# Patient Record
Sex: Female | Born: 1965
Health system: Southern US, Community
[De-identification: ages and names within clinical notes are randomized; demographics above are authoritative.]

## PROBLEM LIST (undated history)

## (undated) DIAGNOSIS — K7460 Unspecified cirrhosis of liver: Secondary | ICD-10-CM

## (undated) DIAGNOSIS — E119 Type 2 diabetes mellitus without complications: Secondary | ICD-10-CM

---

## 2007-09-08 ENCOUNTER — Emergency Department (HOSPITAL_COMMUNITY): Admission: EM | Admit: 2007-09-08 | Discharge: 2007-09-08 | Payer: Self-pay | Admitting: Emergency Medicine
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
## 2008-05-17 ENCOUNTER — Emergency Department: Payer: Self-pay | Admitting: Emergency Medicine

## 2008-11-17 IMAGING — CR RIGHT FOREARM - 2 VIEW
1 series · 2 of 2 positions shown · non-contrast
Comparison: none

REASON FOR EXAM: foreign body  rm 1
COMMENTS:   LMP: Three weeks ago

PROCEDURE:     DXR - DXR FOREARM RIGHT  - May 18, 2008  [DATE]
RESULT:     The patient has sustained a laceration of the forearm. The
radius and ulna appear intact. I do not see evidence of radiopaque foreign
bodies within the soft tissues.

[Series 1: view not recorded · 0.17mm/px · 2 of 2 slices shown]
[im 1/2]
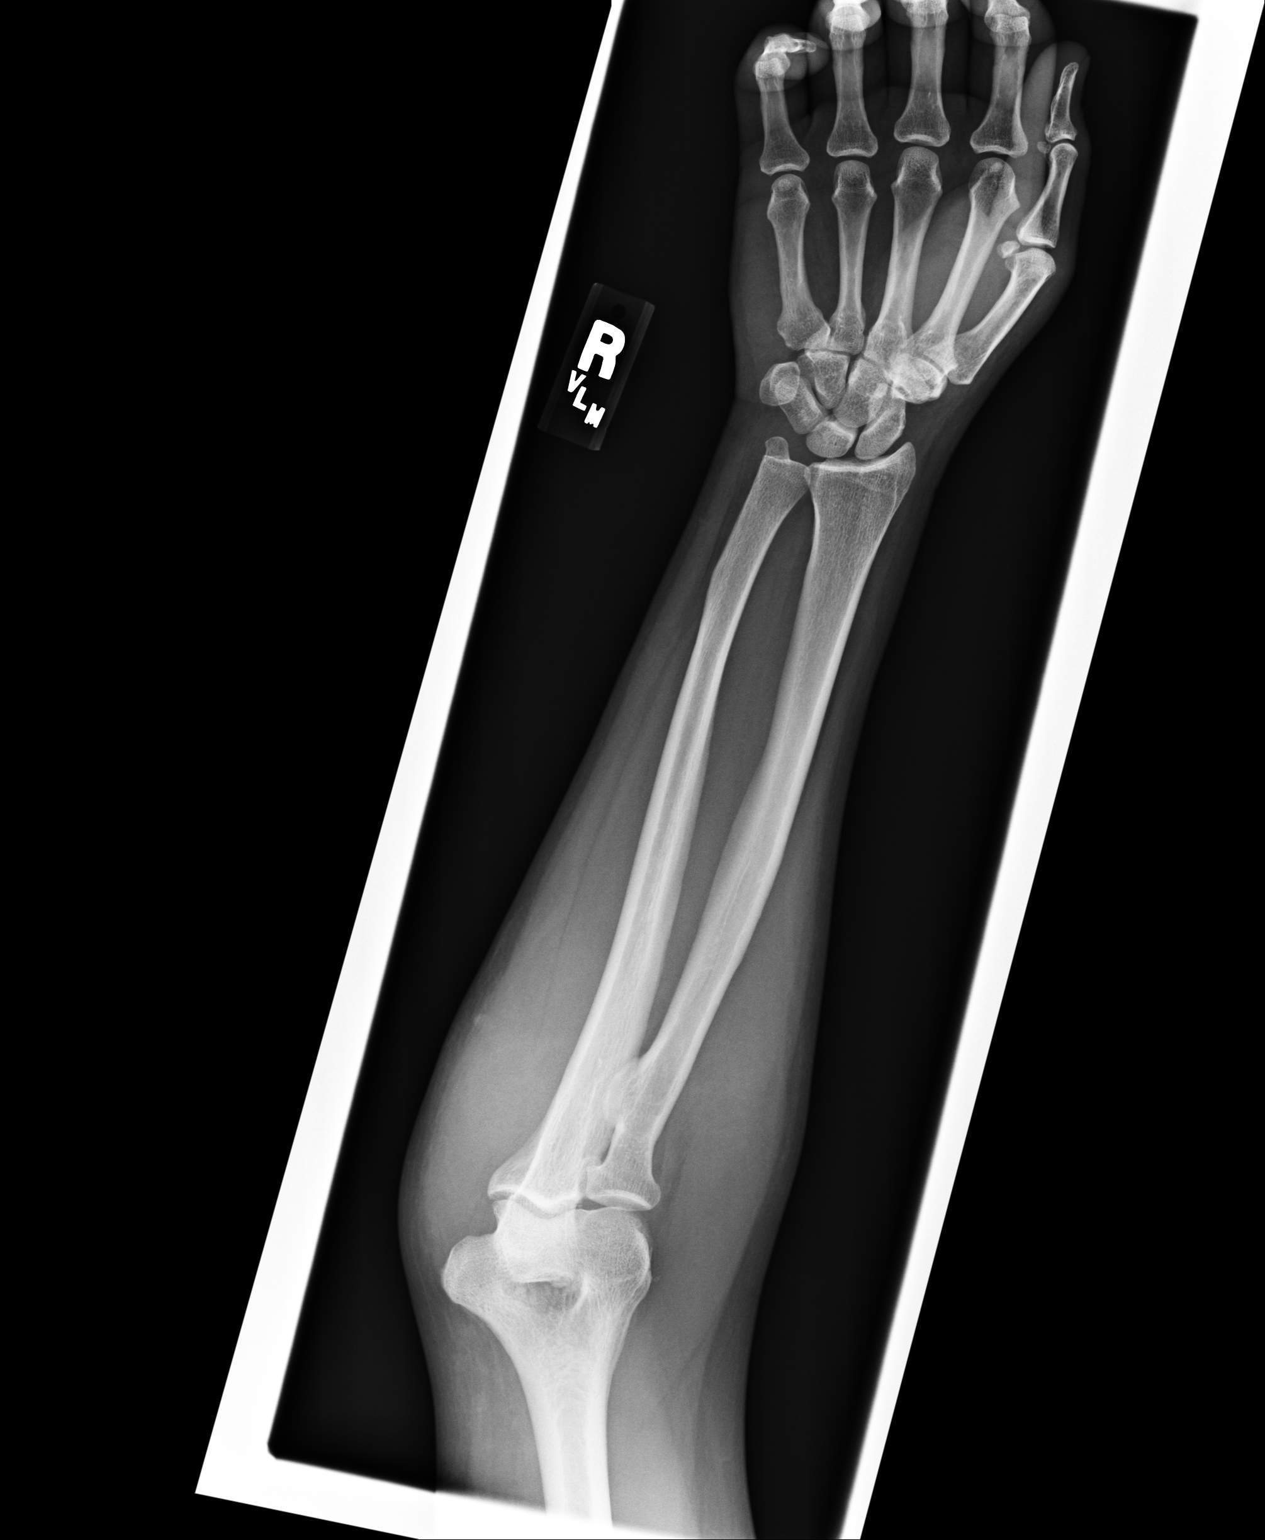
[im 2/2]
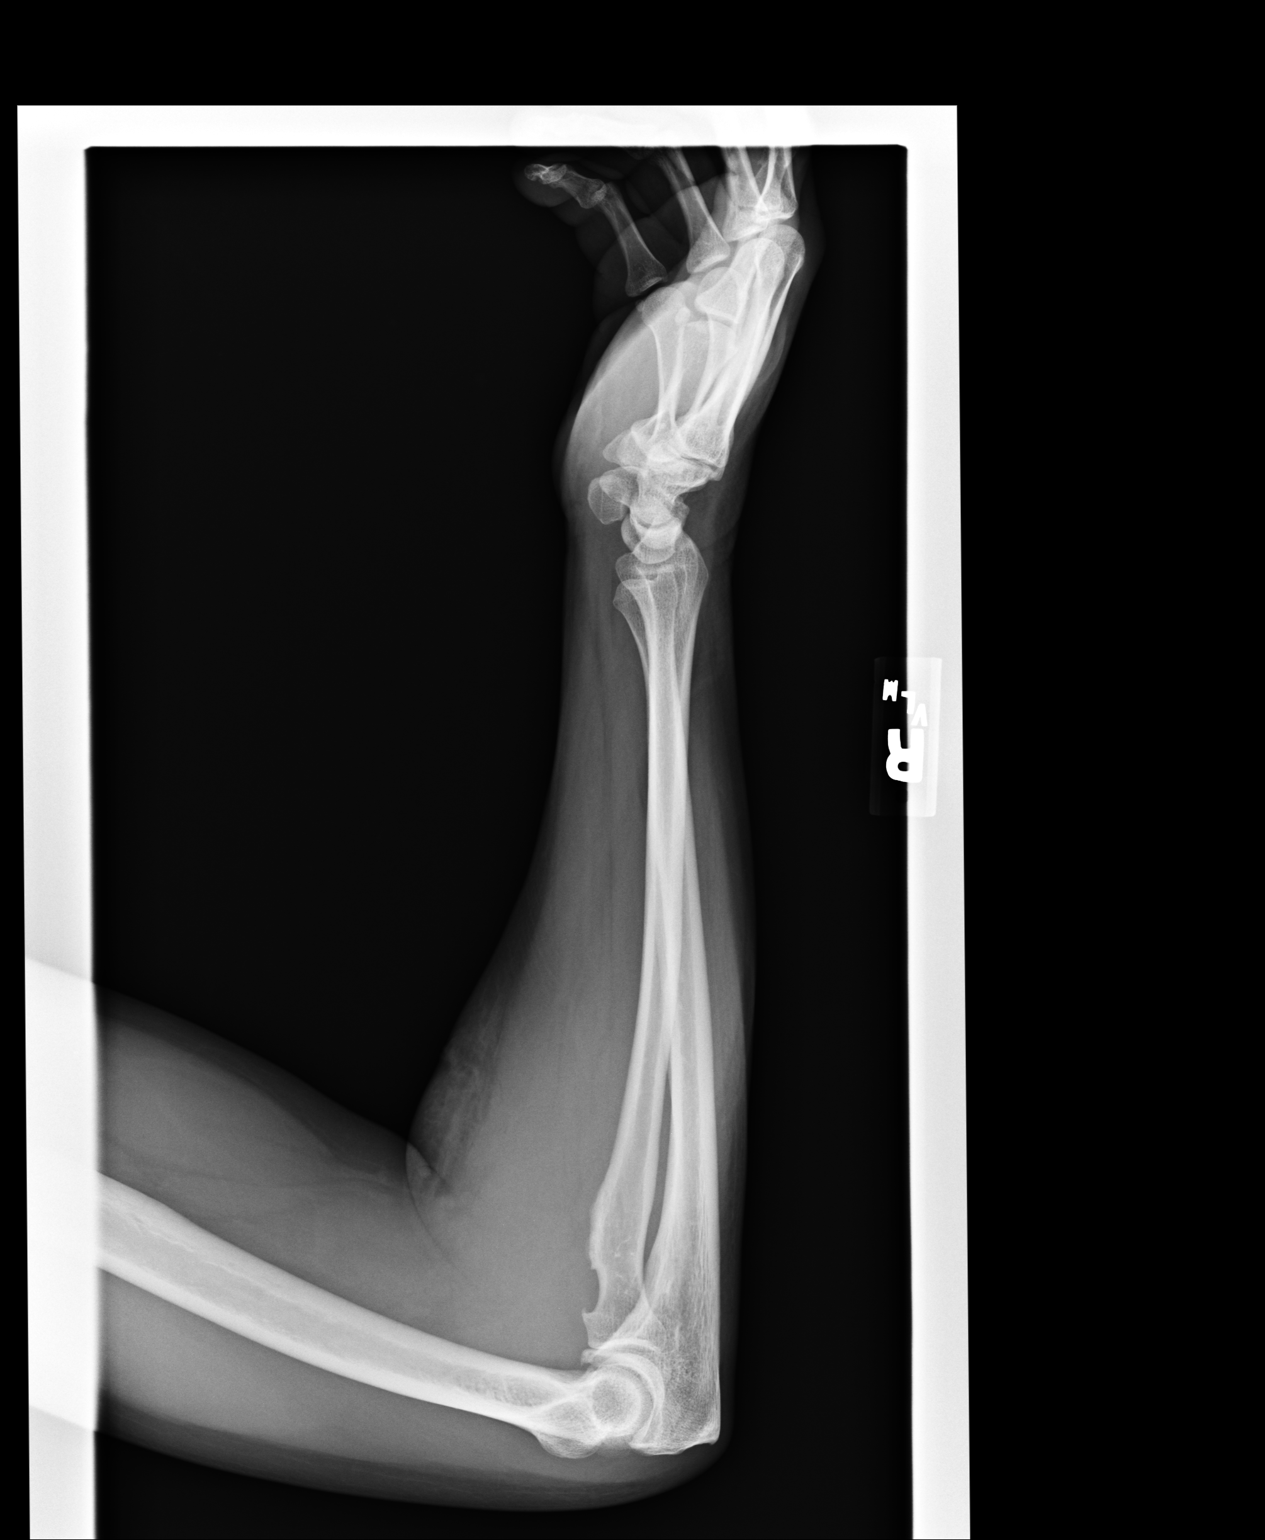

[2 of 2 positions shown; findings below may reference images not displayed]

IMPRESSION: I do not see definite evidence of retained radiopaque
foreign body. No acute bony abnormality is demonstrated.

## 2017-11-20 ENCOUNTER — Emergency Department (HOSPITAL_COMMUNITY)
Admission: EM | Admit: 2017-11-20 | Discharge: 2017-11-20 | Disposition: A | Discharge status: Home / Self Care | Payer: Self-pay | Attending: Emergency Medicine | Admitting: Emergency Medicine

## 2017-11-20 ENCOUNTER — Encounter (HOSPITAL_COMMUNITY): Payer: Self-pay | Admitting: Emergency Medicine

## 2017-11-20 DIAGNOSIS — E119 Type 2 diabetes mellitus without complications: Secondary | ICD-10-CM | POA: Insufficient documentation

## 2017-11-20 DIAGNOSIS — R188 Other ascites: Secondary | ICD-10-CM | POA: Insufficient documentation

## 2017-11-20 DIAGNOSIS — K7460 Unspecified cirrhosis of liver: Secondary | ICD-10-CM | POA: Insufficient documentation

## 2017-11-20 DIAGNOSIS — R51 Headache: Secondary | ICD-10-CM | POA: Insufficient documentation

## 2017-11-20 DIAGNOSIS — F17210 Nicotine dependence, cigarettes, uncomplicated: Secondary | ICD-10-CM | POA: Insufficient documentation

## 2017-11-20 HISTORY — DX: Type 2 diabetes mellitus without complications: E11.9

## 2017-11-20 HISTORY — DX: Unspecified cirrhosis of liver: K74.60

## 2017-11-20 LAB — CBC WITH DIFFERENTIAL/PLATELET
BASOS ABS: 0 10*3/uL (ref 0.0–0.1)
BASOS PCT: 0 %
EOS PCT: 0 %
Eosinophils Absolute: 0 10*3/uL (ref 0.0–0.7)
HCT: 30.8 % — ABNORMAL LOW (ref 36.0–46.0)
Hemoglobin: 10.4 g/dL — ABNORMAL LOW (ref 12.0–15.0)
Lymphocytes Relative: 17 %
Lymphs Abs: 1.5 10*3/uL (ref 0.7–4.0)
MCH: 33.3 pg (ref 26.0–34.0)
MCHC: 33.8 g/dL (ref 30.0–36.0)
MCV: 98.7 fL (ref 78.0–100.0)
MONO ABS: 0.9 10*3/uL (ref 0.1–1.0)
Monocytes Relative: 10 %
Neutro Abs: 6.8 10*3/uL (ref 1.7–7.7)
Neutrophils Relative %: 73 %
PLATELETS: 265 10*3/uL (ref 150–400)
RBC: 3.12 MIL/uL — ABNORMAL LOW (ref 3.87–5.11)
RDW: 12.7 % (ref 11.5–15.5)
WBC: 9.3 10*3/uL (ref 4.0–10.5)

## 2017-11-20 LAB — COMPREHENSIVE METABOLIC PANEL
ALBUMIN: 1.8 g/dL — AB (ref 3.5–5.0)
ALK PHOS: 182 U/L — AB (ref 38–126)
ALT: 23 U/L (ref 14–54)
AST: 66 U/L — AB (ref 15–41)
Anion gap: 8 (ref 5–15)
BILIRUBIN TOTAL: 1.6 mg/dL — AB (ref 0.3–1.2)
CALCIUM: 8.1 mg/dL — AB (ref 8.9–10.3)
CO2: 23 mmol/L (ref 22–32)
CREATININE: 0.34 mg/dL — AB (ref 0.44–1.00)
Chloride: 97 mmol/L — ABNORMAL LOW (ref 101–111)
GFR calc Af Amer: >60 mL/min (ref >60)
GFR calc non Af Amer: >60 mL/min (ref >60)
GLUCOSE: 129 mg/dL — AB (ref 65–99)
Potassium: 3.5 mmol/L (ref 3.5–5.1)
Sodium: 128 mmol/L — ABNORMAL LOW (ref 135–145)
TOTAL PROTEIN: 6 g/dL — AB (ref 6.5–8.1)

## 2017-11-20 LAB — PROTIME-INR
INR: 1.26
PROTHROMBIN TIME: 15.7 s — AB (ref 11.4–15.2)

## 2017-11-20 LAB — AMMONIA: Ammonia: 23 umol/L (ref 9–35)

## 2017-11-20 MED ORDER — SPIRONOLACTONE 25 MG PO TABS: 25.0000 mg | ORAL_TABLET | Freq: Two times a day (BID) | ORAL | 2 refills | Status: AC

## 2017-11-20 NOTE — ED Triage Notes (Signed)
Patient diagnosed recently with cirrhosis of liver in [place] and had little fluid drained off abd but has since come to stay with family in [place]. Patient c/o ascites that has gotten worse over past 3 weeks and swelling in bilat lower extremities.

## 2017-11-20 NOTE — ED Provider Notes (Signed)
[facility]-EMERGENCY DEPT Provider Note   CSN: [id number] Arrival date & time: 11/20/17  0847     History   Chief Complaint Chief Complaint  Patient presents with  . Ascites  . Leg Swelling  . Headache    HPI Nicole Elliott is a 51 y.o. female.  She presents for evaluation of abdominal and leg swelling, present for several weeks.  She was evaluated for the same problem at a hospital in [place], on 11/03/17.  At that time she was told that she had cirrhosis with ascites.  She did not receive treatments at discharge.  She is here, now, because her daughter encouraged the patient to come to [place] to be with her.  Patient reports occasional pain in her abdomen and legs, but denies chest pain, shortness of breath, weakness or dizziness.  She has chronically low blood pressure for which she takes Florinef.  She drinks alcohol, primarily beer.  She has not previously been treated for ascites or cirrhosis.  There are no other known modifying factors.  HPI  Past Medical History:  Diagnosis Date  . Cirrhosis (HCC)   . Diabetes mellitus without complication (HCC)     There are no active problems to display for this patient.   History reviewed. No pertinent surgical history.  OB History    No data available       Home Medications    Prior to Admission medications   Medication Sig Start Date End Date Taking? Authorizing Provider  fludrocortisone (FLORINEF) 0.1 MG tablet Take 0.1 mg by mouth 2 (two) times daily.   Yes [provider]  folic acid (FOLVITE) 400 MCG tablet Take 400 mcg by mouth daily.   Yes [provider]  Multiple Vitamins-Minerals [facility](GNP CENTURY ADULTS 50+ SENIOR) TABS Take 1 tablet by mouth daily.   Yes [provider]  sodium chloride 1 g tablet Take 1 g by mouth daily.   Yes [provider]  thiamine (VITAMIN B-1) 100 MG tablet Take 100 mg by mouth daily.   Yes [provider]    spironolactone (ALDACTONE) 25 MG tablet Take 1 tablet (25 mg total) by mouth 2 (two) times daily. 11/20/17   Mancel BaleWentz, Terrea Bruster, MD    Family History No family history on file.  Social History Social History   Tobacco Use  . Smoking status: Current Every Day Smoker    Types: Cigarettes  . Smokeless tobacco: Never Used  Substance Use Topics  . Alcohol use: Yes  . Drug use: Not on file     Allergies   Naproxen   Review of Systems Review of Systems  All other systems reviewed and are negative.    Physical Exam Updated Vital Signs BP 110/84   Pulse (!) 112   Temp 98.5 F (36.9 C) (Oral)   Resp 18   Ht 5' 5.5" (1.664 m)   SpO2 100%   Physical Exam  Constitutional: She is oriented to person, place, and time. She appears well-developed. She does not appear ill.  Appears older than stated age  HENT:  Head: Normocephalic and atraumatic.  Eyes: Conjunctivae and EOM are normal. Pupils are equal, round, and reactive to light.  Neck: Normal range of motion and phonation normal. Neck supple.  Cardiovascular: Normal rate and regular rhythm.  Pulmonary/Chest: Effort normal and breath sounds normal. She exhibits no tenderness.  Abdominal: Soft. She exhibits distension (With evaluation consistent with presence of ascites). She exhibits no mass. There is no tenderness.  There is no guarding.  Musculoskeletal: Normal range of motion. She exhibits edema (3+ bilateral legs).  Neurological: She is alert and oriented to person, place, and time. She exhibits normal muscle tone.  Skin: Skin is warm and dry.  Psychiatric: She has a normal mood and affect. Her behavior is normal. Judgment and thought content normal.  Nursing note and vitals reviewed.    ED Treatments / Results  Labs (all labs ordered are listed, but only abnormal results are displayed) Labs Reviewed  COMPREHENSIVE METABOLIC PANEL - Abnormal; Notable for the following components:      Result Value   Sodium 128 (*)     Chloride 97 (*)    Glucose, Bld 129 (*)    BUN <5 (*)    Creatinine, Ser 0.34 (*)    Calcium 8.1 (*)    Total Protein 6.0 (*)    Albumin 1.8 (*)    AST 66 (*)    Alkaline Phosphatase 182 (*)    Total Bilirubin 1.6 (*)    All other components within normal limits  CBC WITH DIFFERENTIAL/PLATELET - Abnormal; Notable for the following components:   RBC 3.12 (*)    Hemoglobin 10.4 (*)    HCT 30.8 (*)    All other components within normal limits  PROTIME-INR - Abnormal; Notable for the following components:   Prothrombin Time 15.7 (*)    All other components within normal limits  AMMONIA    EKG  EKG Interpretation None       Radiology No results found.  Procedures Procedures (including critical care time)  Medications Ordered in ED Medications - No data to display   Initial Impression / Assessment and Plan / ED Course  I have reviewed the triage vital signs and the nursing notes.  Pertinent labs & imaging results that were available during my care of the patient were reviewed by me and considered in my medical decision making (see chart for details).  Clinical Course as of Nov 20 1600  Mon Nov 20, 2017  1211 Recent visit, on 11/03/17, at [facility] [place] hospital in [place], reviewed, through care everywhere.  At that time she had nonspecific transaminitis, and left AMA prior to completion of treatment.  [EW]    Clinical Course User Index [EW] Mancel BaleWentz, Loki Wuthrich, MD     Patient Vitals for the past 24 hrs:  BP Temp Temp src Pulse Resp SpO2 Height  11/20/17 1300 110/84 - - (!) 112 18 100 % -  11/20/17 1245 - - - (!) 120 - 100 % -  11/20/17 1230 107/79 - - (!) 113 - 100 % -  11/20/17 1215 - - - (!) 106 - 99 % -  11/20/17 1200 106/80 - - (!) 108 - 97 % -  11/20/17 1114 112/82 98.5 F (36.9 C) Oral (!) 108 18 100 % -  11/20/17 0902 113/80 97.9 F (36.6 C) Oral (!) 110 18 100 % -  11/20/17 0859 - - - - - - 5' 5.5" (1.664 m)    At discharge- reevaluation with  update and discussion. After initial assessment and treatment, an updated evaluation reveals no change in clinical status, findings discussed with patient, and family member, all questions answered. Mancel BaleElliott Mckade Gurka      Final Clinical Impressions(s) / ED Diagnoses   Final diagnoses:  Other ascites    Ascites with liver disease, doubt spontaneous peritonitis.  No evidence for significant metabolic or infectious process.    Nursing Notes Reviewed/ Care Coordinated  Applicable Imaging Reviewed Interpretation of Laboratory Data incorporated into ED treatment  The patient appears reasonably screened and/or stabilized for discharge and I doubt any other medical condition or other [facility] requiring further screening, evaluation, or treatment in the ED at this time prior to discharge.  Plan: Home Medications-continue usual medications; Home Treatments-rest, low-salt diet; return here if the recommended treatment, does not improve the symptoms; Recommended follow up-PCP, as needed, and for a checkup and 1-2 weeks to establish ongoing management.   ED Discharge Orders        Ordered    spironolactone (ALDACTONE) 25 MG tablet  2 times daily     11/20/17 1318       Mancel Bale, MD 11/20/17 [phone]

## 2017-11-20 NOTE — ED Notes (Signed)
Daughter went to [employer] to get patient some food. Will return shortly

## 2017-11-20 NOTE — Discharge Instructions (Signed)
Avoid all forms of alcohol, because they can worsen your swelling and liver disease.  Try to eat a low-salt diet.  Follow-up with a primary care doctor as soon as possible for further evaluation and treatment.

## 2017-12-01 ENCOUNTER — Ambulatory Visit (INDEPENDENT_AMBULATORY_CARE_PROVIDER_SITE_OTHER): Payer: Self-pay | Admitting: Family Medicine

## 2017-12-01 ENCOUNTER — Encounter: Payer: Self-pay | Admitting: Family Medicine

## 2017-12-01 ENCOUNTER — Telehealth: Payer: Self-pay | Admitting: Family Medicine

## 2017-12-01 VITALS — BP 114/84 | HR 92 | Temp 98.7°F | Resp 14 | Ht 65.5 in | Wt 138.0 lb

## 2017-12-01 DIAGNOSIS — R8290 Unspecified abnormal findings in urine: Secondary | ICD-10-CM

## 2017-12-01 DIAGNOSIS — R200 Anesthesia of skin: Secondary | ICD-10-CM

## 2017-12-01 DIAGNOSIS — K7031 Alcoholic cirrhosis of liver with ascites: Secondary | ICD-10-CM

## 2017-12-01 DIAGNOSIS — Z131 Encounter for screening for diabetes mellitus: Secondary | ICD-10-CM

## 2017-12-01 DIAGNOSIS — R202 Paresthesia of skin: Secondary | ICD-10-CM

## 2017-12-01 DIAGNOSIS — F1010 Alcohol abuse, uncomplicated: Secondary | ICD-10-CM

## 2017-12-01 DIAGNOSIS — Z1329 Encounter for screening for other suspected endocrine disorder: Secondary | ICD-10-CM

## 2017-12-01 DIAGNOSIS — M6281 Muscle weakness (generalized): Secondary | ICD-10-CM

## 2017-12-01 DIAGNOSIS — Z23 Encounter for immunization: Secondary | ICD-10-CM

## 2017-12-01 LAB — POCT URINALYSIS DIP (DEVICE)
GLUCOSE, UA: NEGATIVE mg/dL
Hgb urine dipstick: NEGATIVE
Ketones, ur: 15 mg/dL — AB
Leukocytes, UA: NEGATIVE
Nitrite: POSITIVE — AB
PROTEIN: 30 mg/dL — AB
Specific Gravity, Urine: >=1.03 (ref 1.005–1.030)
UROBILINOGEN UA: 1 mg/dL (ref 0.0–1.0)
pH: 5.5 (ref 5.0–8.0)

## 2017-12-01 LAB — POCT GLYCOSYLATED HEMOGLOBIN (HGB A1C): Hemoglobin A1C: 5.2

## 2017-12-01 MED ORDER — FUROSEMIDE 40 MG PO TABS: 40.0000 mg | ORAL_TABLET | Freq: Two times a day (BID) | ORAL | 2 refills | Status: AC

## 2017-12-01 MED ORDER — POTASSIUM CHLORIDE ER 10 MEQ PO TBCR: 10.0000 meq | EXTENDED_RELEASE_TABLET | Freq: Every day | ORAL | 1 refills | Status: AC

## 2017-12-01 MED ORDER — FLUDROCORTISONE ACETATE 0.1 MG PO TABS: 0.1000 mg | ORAL_TABLET | Freq: Every day | ORAL | 1 refills | Status: AC

## 2017-12-01 MED FILL — ?FUROSEMIDE 40 MG TABLET: 40 | 30 days supply | Qty: 60 | Fill #0 | Status: TO

## 2017-12-01 MED FILL — FLUDROCORTISONE 0.1 MG TAB: 0.1 | 30 days supply | Qty: 30 | Fill #0 | Status: TO

## 2017-12-01 MED FILL — POTASSIUM CL 10 MEQ TAB SA: 10 | 30 days supply | Qty: 30 | Fill #0 | Status: TO

## 2017-12-01 NOTE — Patient Instructions (Addendum)
Local mental health resources: [facility] [place], [place] [phone]  Complete [facility] Financial Assistance.  For fluid retention continue the spironolactone I am adding furosemide 40 mg twice daily.  Take 1 dose of potassium 10 mEq daily.  Increase water intake to prevent dehydration.  I am decreasing fludrocortisone 0.1 once daily for orthostatic hypotension.  You will be notified of any abnormal labs.  You will be notified of your scheduled abdominal ultrasound.  If symptoms or fluid retention or abdominal distension worsens go immediately to the ED.  I am referring you to gastroenterology for evaluation of liver disease.  Due to worsening bilateral leg weakness I am sending you to neurology for further evaluation.      Alcoholic Liver Disease Alcoholic liver disease happens when the liver does not work the way it should. The condition is caused by drinking too much alcohol for many years. Follow these instructions at home:  Do not drink alcohol.  Take medicines only as told by your doctor.  Take vitamins only as told by your doctor.  Follow any diet instructions that your doctor gave you. You may need to: ? Eat foods that have thiamine. These include whole-wheat cereals, pork, and raw vegetables. ? Eat foods that have folic acid. These include vegetables, fruits, meats, beans, nuts, and dairy foods. ? Eat foods that are high in carbohydrates. These include yogurt, beans, potatoes, and rice. Contact a doctor if:  You have a fever.  You are short of breath.  You have trouble breathing.  You have bright red blood in your poop (stool).  Your poop looks like tar.  You throw up (vomit) blood.  Your skin looks more yellow, pale, or dark.  You get headaches.  You have trouble thinking.  You have trouble balancing or walking. This information is not intended to replace advice given to you by your health care provider. Make  sure you discuss any questions you have with your health care provider. Document Released: 09/18/2009 Document Revised: 04/28/2016 Document Reviewed: 10/23/2014 Elsevier Interactive Patient Education  [employer]2018 Elsevier Inc.
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
## 2017-12-01 NOTE — Telephone Encounter (Signed)
Left a detailed message for patient with appointment information.

## 2017-12-01 NOTE — Telephone Encounter (Signed)
Please schedule abdominal ulstrasound complete. Self pay, no insurance. Wednesday preferable

## 2017-12-04 ENCOUNTER — Other Ambulatory Visit: Payer: Self-pay | Admitting: Family Medicine

## 2017-12-04 LAB — MAGNESIUM: MAGNESIUM: 1.9 mg/dL (ref 1.6–2.3)

## 2017-12-04 LAB — CBC WITH DIFFERENTIAL/PLATELET
BASOS ABS: 0 10*3/uL (ref 0.0–0.2)
BASOS: 0 %
EOS (ABSOLUTE): 0 10*3/uL (ref 0.0–0.4)
EOS: 1 %
HEMOGLOBIN: 12.2 g/dL (ref 11.1–15.9)
Hematocrit: 35.6 % (ref 34.0–46.6)
IMMATURE GRANS (ABS): 0 10*3/uL (ref 0.0–0.1)
Immature Granulocytes: 0 %
LYMPHS: 23 %
Lymphocytes Absolute: 1.5 10*3/uL (ref 0.7–3.1)
MCH: 33.1 pg — AB (ref 26.6–33.0)
MCHC: 34.3 g/dL (ref 31.5–35.7)
MCV: 97 fL (ref 79–97)
MONOCYTES: 9 %
Monocytes Absolute: 0.6 10*3/uL (ref 0.1–0.9)
NEUTROS ABS: 4.4 10*3/uL (ref 1.4–7.0)
Neutrophils: 67 %
Platelets: 307 10*3/uL (ref 150–379)
RBC: 3.69 x10E6/uL — ABNORMAL LOW (ref 3.77–5.28)
RDW: 13 % (ref 12.3–15.4)
WBC: 6.5 10*3/uL (ref 3.4–10.8)

## 2017-12-04 LAB — THYROID PANEL WITH TSH
Free Thyroxine Index: 2.2 (ref 1.2–4.9)
T3 UPTAKE RATIO: 34 % (ref 24–39)
T4 TOTAL: 6.5 ug/dL (ref 4.5–12.0)
TSH: 2.04 u[IU]/mL (ref 0.450–4.500)

## 2017-12-04 LAB — COMPREHENSIVE METABOLIC PANEL
ALT: 18 IU/L (ref 0–32)
AST: 41 IU/L — ABNORMAL HIGH (ref 0–40)
Albumin/Globulin Ratio: 0.6 — ABNORMAL LOW (ref 1.2–2.2)
Albumin: 2.3 g/dL — ABNORMAL LOW (ref 3.5–5.5)
Alkaline Phosphatase: 171 IU/L — ABNORMAL HIGH (ref 39–117)
BUN/Creatinine Ratio: 12 (ref 9–23)
BUN: 5 mg/dL — AB (ref 6–24)
Bilirubin Total: 1.4 mg/dL — ABNORMAL HIGH (ref 0.0–1.2)
CALCIUM: 8.2 mg/dL — AB (ref 8.7–10.2)
CO2: 24 mmol/L (ref 20–29)
Chloride: 95 mmol/L — ABNORMAL LOW (ref 96–106)
Creatinine, Ser: 0.42 mg/dL — ABNORMAL LOW (ref 0.57–1.00)
GFR, EST AFRICAN AMERICAN: 138 mL/min/{1.73_m2} (ref >59)
GFR, EST NON AFRICAN AMERICAN: 120 mL/min/{1.73_m2} (ref >59)
GLUCOSE: 146 mg/dL — AB (ref 65–99)
Globulin, Total: 4.1 g/dL (ref 1.5–4.5)
Potassium: 3.9 mmol/L (ref 3.5–5.2)
Sodium: 131 mmol/L — ABNORMAL LOW (ref 134–144)
TOTAL PROTEIN: 6.4 g/dL (ref 6.0–8.5)

## 2017-12-04 LAB — HEPATITIS C VRS RNA DETECT BY PCR-QUAL: HCV RNA NAA QUALITATIVE: NEGATIVE

## 2017-12-04 LAB — VITAMIN B1: Thiamine: 128 nmol/L (ref 66.5–200.0)

## 2017-12-04 MED ORDER — CEPHALEXIN 500 MG PO CAPS: 500.0000 mg | ORAL_CAPSULE | Freq: Two times a day (BID) | ORAL | 0 refills | Status: DC

## 2017-12-04 NOTE — Progress Notes (Unsigned)
Urine culture positive for E.coli. Kelfex 500 mg twice daily x 10 days pended until patient calls back to verify pharmacy. [facility] closed today.

## 2017-12-05 ENCOUNTER — Encounter: Payer: Self-pay | Admitting: Family Medicine

## 2017-12-05 ENCOUNTER — Telehealth: Payer: Self-pay

## 2017-12-05 DIAGNOSIS — M6281 Muscle weakness (generalized): Secondary | ICD-10-CM | POA: Insufficient documentation

## 2017-12-05 DIAGNOSIS — F1010 Alcohol abuse, uncomplicated: Secondary | ICD-10-CM | POA: Insufficient documentation

## 2017-12-05 DIAGNOSIS — K7031 Alcoholic cirrhosis of liver with ascites: Secondary | ICD-10-CM | POA: Insufficient documentation

## 2017-12-05 DIAGNOSIS — R202 Paresthesia of skin: Secondary | ICD-10-CM

## 2017-12-05 DIAGNOSIS — R200 Anesthesia of skin: Secondary | ICD-10-CM | POA: Insufficient documentation

## 2017-12-05 LAB — URINE CULTURE

## 2017-12-05 MED ORDER — CEPHALEXIN 500 MG PO CAPS: 500.0000 mg | ORAL_CAPSULE | Freq: Two times a day (BID) | ORAL | 0 refills | Status: AC

## 2017-12-06 ENCOUNTER — Telehealth: Payer: Self-pay | Admitting: Family Medicine

## 2017-12-06 ENCOUNTER — Ambulatory Visit: Payer: Self-pay | Admitting: Family Medicine

## 2017-12-06 ENCOUNTER — Ambulatory Visit (HOSPITAL_COMMUNITY)
Admission: RE | Admit: 2017-12-06 | Discharge: 2017-12-06 | Disposition: A | Discharge status: Home / Self Care | Payer: Self-pay | Source: Ambulatory Visit | Attending: Family Medicine | Admitting: Family Medicine

## 2017-12-06 DIAGNOSIS — K7031 Alcoholic cirrhosis of liver with ascites: Secondary | ICD-10-CM

## 2017-12-06 DIAGNOSIS — K7689 Other specified diseases of liver: Secondary | ICD-10-CM | POA: Insufficient documentation

## 2017-12-06 DIAGNOSIS — R188 Other ascites: Secondary | ICD-10-CM | POA: Insufficient documentation

## 2017-12-06 DIAGNOSIS — K829 Disease of gallbladder, unspecified: Secondary | ICD-10-CM | POA: Insufficient documentation

## 2017-12-06 MED FILL — CEPHALEXIN 500 MG CAPSULE: 500 | 10 days supply | Qty: 20 | Fill #0

## 2017-12-06 NOTE — Progress Notes (Signed)
Patient notified

## 2017-12-06 NOTE — Telephone Encounter (Signed)
Call patient to update her on the status of her recent abdominal ultrasound.  Voicemail message left. The ultrasound was significant for a mild nodular changes to the liver and there were the presence of gallbladder wall thickening. Patient has previously been referred to gastroenterology however according to the referral notes patient has declined both gastroenterology and neurology referrals until she is able to obtain a payor source either through [facility] Financial Assistance and or Medicaid.  Godfrey PickKimberly S. Tiburcio PeaHarris, MSN, FNP-C [facility]  [address] [place], [place] [address] [phone]

## 2017-12-06 NOTE — Progress Notes (Unsigned)
Nicole Elliott,  Please contact patient to advise urine culture was positive for infection. Attempted to reach her over the holiday, no answer. I have sent a prescription for Keflex 500 mg twice daily to [facility]

## 2017-12-06 NOTE — Telephone Encounter (Signed)
Patient notified

## 2017-12-07 NOTE — Telephone Encounter (Signed)
Spoke with patient daughter regarding US results. Patient is waiting for medicaid to come through before she schedule any appointment with neuro or GI.

## 2017-12-08 ENCOUNTER — Ambulatory Visit (INDEPENDENT_AMBULATORY_CARE_PROVIDER_SITE_OTHER): Payer: Self-pay | Admitting: Family Medicine

## 2017-12-08 ENCOUNTER — Encounter: Payer: Self-pay | Admitting: Family Medicine

## 2017-12-08 VITALS — BP 100/68 | HR 78 | Temp 97.9°F | Resp 16 | Ht 65.5 in | Wt 127.4 lb

## 2017-12-08 DIAGNOSIS — Z599 Problem related to housing and economic circumstances, unspecified: Secondary | ICD-10-CM

## 2017-12-08 DIAGNOSIS — K7031 Alcoholic cirrhosis of liver with ascites: Secondary | ICD-10-CM

## 2017-12-08 DIAGNOSIS — Z598 Other problems related to housing and economic circumstances: Secondary | ICD-10-CM

## 2017-12-08 DIAGNOSIS — N3000 Acute cystitis without hematuria: Secondary | ICD-10-CM

## 2017-12-08 NOTE — Patient Instructions (Addendum)
Cranberry tablets at least twice for prevention of UTI. Complete all antibiotics.   Blood pressure should remain greater than 98/60. If drops lower than 98 systolic or lower than 60, resume fludrocortisone.  If she complains of dizziness, notify me here at the office and resume.   Reducing Furosemide at reduced dose 40 mg once daily.  If symptoms worsen or do not improve, go immediately to the ED.  Continue to make efforts to complete financial assistance for speciality evaluation.   Continue to avoid alcohol and follow-up with [facility].  Local mental health resources:   [facility], [place], [place] [phone]   What You Need To Know About Alcohol Abuse and Dependence, Adult Alcohol is a widely available drug. People who use alcohol will consume it in varying amounts. People who drink alcohol in excess, and have behavior problems during and after drinking alcohol, may have what is called an alcohol use disorder. Alcohol abuse and alcohol dependence are the two main types of alcohol use disorders:  Alcohol abuse is when you use alcohol too much or too often. You may use alcohol to make yourself feel happy or to reduce stress, but you may have a hard time setting a limit on the amount you drink.  Alcohol dependence is when you use alcohol excessively for a period of time, and your body and brain chemistry changes as a result. This can make it hard to stop drinking because you may start to feel sick or feel different when you do not use alcohol.  How can alcohol abuse and dependence affect me? Alcohol abuse and dependence can have a negative effect on your life. Excessive use of alcohol may lead to an addiction. You may feel like you need alcohol to function normally. You may drink alcohol before work in the morning, during the day, or as soon as you get home from work in the evening. These actions can result in:  Poor performance at  work.  Losing your job.  Financial problems.  Car crashes or criminal charges from driving after drinking alcohol.  Problems in your relationships with friends and family.  Losing the trust and respect of co-workers, friends, and family.  Drinking heavily over a long period of time can permanently damage your body and brain, and can cause lifelong health issues, such as:  Liver disease.  Heart problems, high blood pressure, or stroke.  Damage to your pancreas.  Certain cancers.  Decreased ability to fight infections.  Numbness or tingling in hands or feet (neuropathy).  Brain damage.  Depression.  Early (premature) death.  When your body craves alcohol, it is easy to drink more than your body can handle. As a result, you may overdose. Alcohol overdose is a serious situation that requires hospitalization. It may lead to permanent injuries or death. What are the benefits of avoiding alcohol use? Limiting or avoiding alcohol can help you:  Avoid risks to your body, brain, and relationships.  Avoid the risk of abusing or becoming dependent on alcohol.  Keep your mind and body healthy. As a result, you may be more likely to accomplish your life goals.  Avoid permanent injury, organ damage, or death due to alcohol use.  What steps can I take to stop drinking?  The best way to avoid alcohol abuse, dependence, and addiction is not to drink at all, or to drink measured amounts. Measured drinking means no more than 1 drink a day for nonpregnant women and 2  drinks a day for men. One drink equals 12 oz of beer, 5 oz of wine, or 1 oz of hard liquor.  Stop drinking if you have been drinking too much. This can be very hard to do if you are used to abusing alcohol. If you find it hard to stop drinking, talk about your experience with someone you trust. This person may be able to help you change your drinking behavior.  Instead of drinking alcohol, do something else, like a hobby or  exercise.  Find healthy ways to cope with stress, such as exercise, meditation, or spending time with people you care about.  In social gatherings and places where there may be alcohol, make intentional choices to drink non-alcohol beverages.  If your family, co-workers, or friends drink, talk to them about supporting you in your efforts to stop drinking. Ask them not to drink around you. Spend more time with people who do not drink alcohol.  If you think that you have an alcohol dependency problem: ? Tell friends or family about your concerns. ? Talk with your health care provider or another health professional about where to get help. ? Work with a Paramedic and a Network engineer. ? Consider joining a support group for people who struggle with alcohol abuse, dependence, and addiction. Where to find support: You can get support for preventing alcohol abuse, dependence, and addiction from:  Your health care provider.  Alcoholics Anonymous (AA): SalaryStart.tn  SMART Recovery: www.smartrecovery.org  Local treatment centers or chemical dependency counselors.  Where to find more information: Learn more about alcohol abuse and dependence from:  Centers for Disease Control and Prevention: GreenTraditions.fi  [employer] on Alcohol Abuse and Alcoholism: CyberComps.hu  Local AA groups in your community.  Contact a health care provider if:  You drink more or for longer than you intended, on more than one occasion.  You tried to stop drinking or to cut back on how much you drink, but you were not able to.  You often drink to the point of vomiting or passing out.  You want to drink so badly that you cannot think about anything else.  Drinking has created problems in your life, but you continue to drink.  You keep drinking even though you feel anxious, depressed, or have experienced memory  loss.  You have stopped doing the things you used to enjoy in order to drink.  You have to drink more than you used to in order to get the effect you want.  You experience anxiety, sweating, nausea, shakiness, and trouble sleeping when you try to stop drinking.  You have thoughts about hurting yourself or others. If you ever feel like you may hurt yourself or others, or have thoughts about taking your own life, get help right away. You can go to your nearest emergency department or call:  Your local emergency services (911 in the [place]  A suicide crisis helpline, such as the National Suicide Prevention Lifeline at 838-752-2100. This is open 24 hours a day.  Summary  Alcohol is a widely available drug. Misusing, abusing, and becoming dependent on alcohol can cause many problems.  It is important to measure and limit the amount of alcohol you consume. It is recommended to limit alcohol use to 1 drink a day for nonpregnant women and 2 drinks a day for men.  The risks associated with drinking too much will have a direct negative impact on your work, relationships, and health.  If you realize that  you are having some challenges keeping your drinking under control, find some ways to change your behavior. Hobbies, self calming activities, exercise, or support groups can help.  If you feel you need help with changing your drinking habits, talk with your health care provider, a good friend, or a therapist, or go to an AA group. This information is not intended to replace advice given to you by your health care provider. Make sure you discuss any questions you have with your health care provider. Document Released: 11/15/2016 Document Revised: 11/15/2016 Document Reviewed: 11/15/2016 Elsevier Interactive Patient Education  2018 [employer].    Ascites Ascites is a collection of excess fluid in the abdomen. Ascites can range from mild to severe. It can get worse without treatment. What  are the causes? Possible causes include:  Cirrhosis. This is the most common cause of ascites.  Infection or inflammation in the abdomen.  Cancer in the abdomen.  Heart failure.  Kidney disease.  Inflammation of the pancreas.  Clots in the veins of the liver.  What are the signs or symptoms? Signs and symptoms may include:  A feeling of fullness in your abdomen. This is common.  An increase in the size of your abdomen or your waist.  Swelling in your legs.  Swelling of the scrotum in men.  Difficulty breathing.  Abdominal pain.  Sudden weight gain.  If the condition is mild, you may not have symptoms. How is this diagnosed? To make a diagnosis, your health care provider will:  Ask about your medical history.  Perform a physical exam.  Order imaging tests, such as an ultrasound or CT scan of your abdomen.  How is this treated? Treatment depends on the cause of the ascites. It may include:  Taking a pill to make you urinate. This is called a water pill (diuretic pill).  Strictly reducing your salt (sodium) intake. Salt can cause extra fluid to be kept in the body, and this makes ascites worse.  Having a procedure to remove fluid from your abdomen (paracentesis).  Having a procedure to transfer fluid from your abdomen into a vein.  Having a procedure that connects two of the major veins within your liver and relieves pressure on your liver (TIPS procedure).  Ascites may go away or improve with treatment of the condition that caused it. Follow these instructions at home:  Keep track of your weight. To do this, weigh yourself at the same time every day and record your weight.  Keep track of how much you drink and any changes in the amount you urinate.  Follow any instructions that your health care provider gives you about how much to drink.  Try not to eat salty (high-sodium) foods.  Take medicines only as directed by your health care provider.  Keep  all follow-up visits as directed by your health care provider. This is important.  Report any changes in your health to your health care provider, especially if you develop new symptoms or your symptoms get worse. Contact a health care provider if:  Your gain more than 3 pounds in 3 days.  Your abdominal size or your waist size increases.  You have new swelling in your legs.  The swelling in your legs gets worse. Get help right away if:  You develop a fever.  You develop confusion.  You develop new or worsening difficulty breathing.  You develop new or worsening abdominal pain.  You develop new or worsening swelling in the scrotum (in men).  This information is not intended to replace advice given to you by your health care provider. Make sure you discuss any questions you have with your health care provider. Document Released: 11/21/2005 Document Revised: 03/30/2016 Document Reviewed: 06/20/2014 Elsevier Interactive Patient Education  [employer]2018 Elsevier Inc.

## 2017-12-08 NOTE — Progress Notes (Signed)
Patient ID: Nicole Elliott, female    DOB: Aug 04, 1966, 52 y.o.   MRN: [id number]  PCP: Bing Neighbors, FNP  Chief Complaint  Patient presents with  . Follow-up    1 WEEK  . Abdominal Pain    sharp pain going up the stomach area on left side    Subjective:  HP Patient is accompanied by daughter who is primary caregiver Nicole Elliott is a 52 y.o. female presents for follow-up of abdominal distension secondary to ascites. Patient suffers from chronic, ongoing, alcoholism. [name] presented in office over a week ago to establish care. At the visit, she complained on abdominal pain, abdominal swelling, and reported a prior diagnosis of alcoholic cirrhosis. She presented to [facility] ED 11/20/2017 complaining of abdominal pain and was found to have abdominal distension secondary to ascites. No interventions were performed and  patient was referred here to establish primary care. Patient is uninsured and was provided a [facility] and referred to gastroenterology and neurology during last visit, however cancelled referrals do the cost of the initial visit. She has no valid identification and therefore is unable to complete the financial assistance application. Recent ultrasound of the abdomen confirmed "mild nodular changes of the liver with the presence of ascites".  Today she complains of a occasional sharp left sided abdominal pain with associated nausea and occasional vomiting. She continues to consume alcohol daily ( at least 3 cans of beer daily-reports less intake than previously). Denies diarrhea, constipation, hematemesis, or dark/tarry stools. She has remained compliant with diuretic therapy and feels that some fluid has been remove from lower extremities mostly, although diuretics only mildly improved abdominal distension. Social History   Socioeconomic History  . Marital status: Single    Spouse name: Not on file  . Number of children: Not on file   . Years of education: Not on file  . Highest education level: Not on file  Social Needs  . Financial resource strain: Not on file  . Food insecurity - worry: Not on file  . Food insecurity - inability: Not on file  . Transportation needs - medical: Not on file  . Transportation needs - non-medical: Not on file  Occupational History  . Not on file  Tobacco Use  . Smoking status: Current Every Day Smoker    Types: Cigarettes  . Smokeless tobacco: Never Used  Substance and Sexual Activity  . Alcohol use: Yes  . Drug use: Not on file  . Sexual activity: No  Other Topics Concern  . Not on file  Social History Narrative  . Not on file    Family History  Problem Relation Age of Onset  . Cancer Mother   . Diabetes Mother   . Hypertension Mother   . Cancer Father   . Diabetes Father   . Hypertension Father    Review of Systems  Respiratory: Negative.   Gastrointestinal: Positive for abdominal distention, abdominal pain, nausea and vomiting.  Genitourinary: Negative for dysuria.  Skin: Negative.   Neurological: Positive for dizziness and weakness.  Hematological: Negative.   Psychiatric/Behavioral: Positive for agitation. The patient is nervous/anxious.     Patient Active Problem List   Diagnosis Date Noted  . Alcoholic cirrhosis of liver with ascites (HCC) 12/05/2017  . Muscle weakness (generalized) 12/05/2017  . Numbness and tingling 12/05/2017  . Alcohol abuse 12/05/2017    Allergies  Allergen Reactions  . Naproxen Rash    Prior to Admission medications   Medication  Sig Start Date End Date Taking? Authorizing Provider  cephALEXin (KEFLEX) 500 MG capsule Take 1 capsule (500 mg total) by mouth 2 (two) times daily. 12/05/17  Yes Bing Neighbors, FNP  fludrocortisone (FLORINEF) 0.1 MG tablet Take 1 tablet (0.1 mg total) by mouth daily. 12/01/17  Yes Bing Neighbors, FNP  folic acid (FOLVITE) 400 MCG tablet Take 400 mcg by mouth daily.   Yes [provider]  furosemide (LASIX) 40 MG tablet Take 1 tablet (40 mg total) by mouth 2 (two) times daily. 12/01/17  Yes Bing Neighbors, FNP  Multiple Vitamins-Minerals [facility] ADULTS 50+ SENIOR) TABS Take 1 tablet by mouth daily.   Yes [provider]  potassium chloride (K-DUR) 10 MEQ tablet Take 1 tablet (10 mEq total) by mouth daily. 12/01/17  Yes Bing Neighbors, FNP  sodium chloride 1 g tablet Take 1 g by mouth daily.   Yes [provider]  spironolactone (ALDACTONE) 25 MG tablet Take 1 tablet (25 mg total) by mouth 2 (two) times daily. 11/20/17  Yes Mancel Bale, MD  thiamine (VITAMIN B-1) 100 MG tablet Take 100 mg by mouth daily.   Yes [provider]    Past Medical, Surgical Family and Social History reviewed and updated.    Objective:   Today's Vitals   12/08/17 0846  BP: 100/68  Pulse: 78  Resp: 16  Temp: 97.9 F (36.6 C)  TempSrc: Oral  SpO2: 96%  Weight: 127 lb 6.4 oz (57.8 kg)  Height: 5' 5.5" (1.664 m)    Wt Readings from Last 3 Encounters:  12/08/17 127 lb 6.4 oz (57.8 kg)  12/01/17 138 lb (62.6 kg)    Physical Exam  Constitutional: She is oriented to person, place, and time. She appears well-developed and well-nourished.  HENT:  Head: Normocephalic and atraumatic.  Eyes: Conjunctivae and EOM are normal. Pupils are equal, round, and reactive to light.  Neck: Normal range of motion. Neck supple.  Cardiovascular: Normal rate, regular rhythm, normal heart sounds and intact distal pulses.  Pulmonary/Chest: Effort normal and breath sounds normal.  Abdominal: She exhibits distension.  Musculoskeletal: Normal range of motion.  Neurological: She is alert and oriented to person, place, and time. She exhibits abnormal muscle tone. Coordination and gait abnormal.  Skin: Skin is warm and dry.  Psychiatric: Her speech is normal. Thought content normal. Her mood appears anxious. She is aggressive. Cognition and memory are normal.  Verbally  aggressive today   Assessment & Plan:  1. Acute cystitis without hematuria, diagnosed prior visit. Started antibiotic only yesterday. Negative for signs or symptoms associated with pyelonephritis. Patient is occasionally incontinent. Encouraged family to assist with ensuring patient is not exposed to urine in soiled diapers as this increases the risk for UTI.  2. Ascites due to alcoholic cirrhosis (HCC), US of the abdomen confirmed nodular liver disease and ascites. Patient previously referred to GI unable to follow-up due to cost. Negative Hep C panel. Currently, prescribed chronic diuretic therapy, although abdomen remains grossly distended. Checking BMP and Hep B panel. Patient is encouraged to go to the ED if symptoms worsens or do not improve.  3. Financial problems Patient has received information regarding financial resources in which she is eligible to apply for in order to have financial assistance to follow-up with specialist that she was previously referred to. Due to barrier in obtaining proper identification, financial assistance applications have not been completed. Unfortunately, patient is being advise if symptoms worsen, she should go  the ED for further evaluation and care.  At this point, she needs evaluation by a hepatology specialist and neurologist to evaluate generalized muscle weakness.   Orders Placed This Encounter  Procedures  . Basic metabolic panel  . Hepatitis B surface antibody  . Hepatitis B surface antigen    RTC: 6 weeks for follow-up-check BMP    A total of 25  minutes spent, greater than 50 % of this time was spent reviewing prior medical history, reviewing medications and indications of treatment, prior labs and diagnostic tests, discussing current plan of treatment, health promotion, and goals of treatment.   Godfrey PickKimberly S. Tiburcio PeaHarris, MSN, FNP-C The Patient Care [facility]  [address] [place], [place] [address] [phone]

## 2017-12-09 LAB — BASIC METABOLIC PANEL
BUN / CREAT RATIO: 6 — AB (ref 9–23)
BUN: 3 mg/dL — AB (ref 6–24)
CALCIUM: 7.9 mg/dL — AB (ref 8.7–10.2)
CO2: 30 mmol/L — ABNORMAL HIGH (ref 20–29)
CREATININE: 0.51 mg/dL — AB (ref 0.57–1.00)
Chloride: 87 mmol/L — ABNORMAL LOW (ref 96–106)
GFR, EST AFRICAN AMERICAN: 130 mL/min/{1.73_m2} (ref >59)
GFR, EST NON AFRICAN AMERICAN: 112 mL/min/{1.73_m2} (ref >59)
Glucose: 140 mg/dL — ABNORMAL HIGH (ref 65–99)
Potassium: 2.6 mmol/L — ABNORMAL LOW (ref 3.5–5.2)
Sodium: 132 mmol/L — ABNORMAL LOW (ref 134–144)

## 2017-12-09 LAB — HEPATITIS B SURFACE ANTIBODY, QUANTITATIVE: Hepatitis B Surf Ab Quant: 4 m[IU]/mL — ABNORMAL LOW (ref >9.9)

## 2017-12-09 LAB — HEPATITIS B SURFACE ANTIGEN: Hepatitis B Surface Ag: NEGATIVE

## 2017-12-10 ENCOUNTER — Telehealth: Payer: Self-pay | Admitting: Family Medicine

## 2017-12-10 NOTE — Telephone Encounter (Signed)
Karren BurlyChandra please schedule a lab visit for Friday 12/15/2017 at 830 for patient to have a repeat potassium level.    Godfrey PickKimberly S. Tiburcio PeaHarris, MSN, FNP-C The Patient Care [facility]  [address] [place], [place] [address] [phone]

## 2017-12-10 NOTE — Telephone Encounter (Signed)
Spoke with daughter regarding labs and -potassium level is low. She confirmed that her mother is taking both the spirolactone and potassium, 10 MEQ once daily tablets.  I advised to increase potassium to 20 MEQ once daily starting today and return to the office on Friday for repeat potassium level. Patient is taking both potassium sparing diuretic and a potassium wasting diuretic.  Only increasing replacement of potassium to 20 M EQ's to prevent hyperkalemia.  Nicole PickKimberly S. Tiburcio PeaHarris, MSN, FNP-C The Patient Care [facility]  [address] [place], [place] [address] [phone]

## 2017-12-15 ENCOUNTER — Other Ambulatory Visit: Payer: Self-pay

## 2018-01-08 ENCOUNTER — Encounter: Payer: Self-pay | Admitting: Family Medicine

## 2018-01-19 ENCOUNTER — Ambulatory Visit: Payer: Self-pay | Admitting: Family Medicine
